# Patient Record
Sex: Male | Born: 2005 | Hispanic: No | Marital: Single | Smoking: Never smoker
Health system: Southern US, Community
[De-identification: ages and names within clinical notes are randomized; demographics above are authoritative.]

## PROBLEM LIST (undated history)

## (undated) DIAGNOSIS — Z201 Contact with and (suspected) exposure to tuberculosis: Secondary | ICD-10-CM

## (undated) DIAGNOSIS — H6690 Otitis media, unspecified, unspecified ear: Secondary | ICD-10-CM

## (undated) DIAGNOSIS — K029 Dental caries, unspecified: Secondary | ICD-10-CM

## (undated) HISTORY — DX: Otitis media, unspecified, unspecified ear: H66.90

## (undated) HISTORY — DX: Dental caries, unspecified: K02.9

---
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
## 2024-04-15 ENCOUNTER — Encounter (HOSPITAL_BASED_OUTPATIENT_CLINIC_OR_DEPARTMENT_OTHER): Payer: Self-pay

## 2024-04-15 ENCOUNTER — Emergency Department (HOSPITAL_BASED_OUTPATIENT_CLINIC_OR_DEPARTMENT_OTHER)
Admission: EM | Admit: 2024-04-15 | Discharge: 2024-04-15 | Disposition: A | Discharge status: Home / Self Care | Attending: Emergency Medicine | Admitting: Emergency Medicine

## 2024-04-15 ENCOUNTER — Other Ambulatory Visit: Payer: Self-pay

## 2024-04-15 DIAGNOSIS — H5713 Ocular pain, bilateral: Secondary | ICD-10-CM | POA: Diagnosis present

## 2024-04-15 DIAGNOSIS — H1013 Acute atopic conjunctivitis, bilateral: Secondary | ICD-10-CM | POA: Insufficient documentation

## 2024-04-15 MED ORDER — FLUORESCEIN SODIUM 1 MG OP STRP
1.0000 | ORAL_STRIP | Freq: Once | OPHTHALMIC | Status: AC
  Administered 2024-04-15: 1 via OPHTHALMIC
  Filled 2024-04-15: qty 1

## 2024-04-15 MED ORDER — OLOPATADINE HCL 0.2 % OP SOLN: 1.0000 [drp] | Freq: Every day | OPHTHALMIC | 0 refills | Status: AC

## 2024-04-15 MED ORDER — TETRACAINE HCL 0.5 % OP SOLN
1.0000 [drp] | Freq: Once | OPHTHALMIC | Status: AC
  Administered 2024-04-15: 1 [drp] via OPHTHALMIC
  Filled 2024-04-15: qty 4

## 2024-04-15 MED ORDER — ERYTHROMYCIN 5 MG/GM OP OINT
TOPICAL_OINTMENT | Freq: Once | OPHTHALMIC | Status: AC
  Filled 2024-04-15: qty 3.5

## 2024-04-15 NOTE — ED Notes (Signed)
 [place] pts left eye with LR through morgan lenses.

## 2024-04-15 NOTE — ED Notes (Signed)
 Pt states that his eyes are feeling better just still very sensitive to light.

## 2024-04-15 NOTE — Discharge Instructions (Signed)
 Use the ointment on your eyes before you go to sleep.  You could also try taking a Benadryl.  Then do the drops once a day.  If you are still having issues call the ophthalmologist for an appointment on Monday

## 2024-04-15 NOTE — ED Notes (Signed)
 Woods lamp and tonopen are at bedside.

## 2024-04-15 NOTE — ED Triage Notes (Signed)
 Patient reports bilateral eye pain, burning, redness, and drainage. Says his left eye is worse today. Notes light sensitivity and able to see distance as well. This started 2-3 days ago.

## 2024-04-15 NOTE — ED Notes (Signed)
 [place] pts right eye with LR through morgan lenses

## 2024-04-15 NOTE — ED Provider Notes (Signed)
 [place] EMERGENCY DEPARTMENT AT [facility] Provider Note   CSN: [id number] Arrival date & time: 04/15/24  [id number]     Patient presents with: Eye Problem   Harold Henderson is a 18 y.o. male.   Patient is a 18 year old male with no significant medical problems who is presenting today with complaint of eye pain that is been ongoing for the last 3 to 4 days.  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  He reports that the day he worked as soon as he came home he started having pain in both eyes.  Initially it was worse in the right eye but now it is moved to the left eye and has been more severe.  He reports a photophobia associated with it and clear tearing of the eyes.  He reports it always seems worse after he wakes up from sleep.  He did get some allergy drops at the pharmacy yesterday but is not sure they have helped at all.  Yesterday when he was at work he did wear safety glasses.  He does not wear glasses or contacts.  Has no history of any trauma to the eyes.  Other than using the drops yesterday he has not been putting anything else in his eyes.  Denies any foreign body sensation  The history is provided by the patient.  Eye Problem      Prior to Admission medications   Medication Sig Start Date End Date Taking? Authorizing Provider  Olopatadine HCl 0.2 % SOLN Apply 1 drop to eye daily. 04/15/24  Yes Doretha Folks, MD  Adapalene -Benzoyl Peroxide  (EPIDUO ) 0.1-2.5 % gel Apply topically at bedtime Patient not taking: Reported on 09/03/2020 03/06/20   Herminio Kirsch, MD  carbamide peroxide (DEBROX) 6.5 % OTIC solution Place 5 drops into the left ear 2 (two) times daily. Patient not taking: Reported on 09/03/2020 01/17/20   Segars, James, MD  mupirocin  ointment (BACTROBAN ) 2 % Apply to right ear canal 3 times daily for 1 week Patient not taking:  Reported on 09/03/2020 10/05/19   Herminio Kirsch, MD    Allergies: Patient has no known allergies.    Review of Systems  Updated Vital Signs BP 110/86   Pulse (!) 52   Temp 97.9 F (36.6 C)   Resp 18   SpO2 100%   Physical Exam Vitals reviewed.  HENT:     Head: Normocephalic.  Eyes:     General: Lids are normal. Lids are everted, no foreign bodies appreciated.        Right eye: No foreign body or discharge.        Left eye: No foreign body or discharge.     Intraocular pressure: Right eye pressure is 15 mmHg. Left eye pressure is 21 mmHg. Measurements were taken using a handheld tonometer.    Extraocular Movements: Extraocular movements intact.     Pupils: Pupils are equal, round, and reactive to light.     Right eye: No corneal abrasion or fluorescein uptake.     Left eye: No corneal abrasion or fluorescein uptake.     Comments: Photophobic with tearing in both eyes and minimal conjunctival injection.  Pupils are reactive bilaterally.  Eyelids are normal without any swelling  Neurological:     Mental Status: He is alert. Mental status is at baseline.     (all labs ordered  are listed, but only abnormal results are displayed) Labs Reviewed - No data to display  EKG: None  Radiology: No results found.   Procedures   Medications Ordered in the ED  erythromycin ophthalmic ointment (has no administration in time range)  tetracaine (PONTOCAINE) 0.5 % ophthalmic solution 1 drop (1 drop Both Eyes Given 04/15/24 0820)  fluorescein ophthalmic strip 1 strip (1 strip Both Eyes Given 04/15/24 0827)                                    Medical Decision Making Risk Prescription drug management.   Patient presenting today with symptoms most classic for allergic reaction or contact irritation of bilateral eyes.  Only minimal conjunctival injection and pupils are responsive bilaterally but patient has significant photophobia.  Intraocular pressure is normal bilaterally.  No  corneal abrasions noted.  Patient's eyes were flushed. Patient is still having stinging in the eyes but reports maybe a little better after flushed with Joesph lens.  At this time patient appears stable for discharge but was given ophthalmology follow-up if he continues to have issues.  He was given drops to use in his eyes for potential allergic reaction.     Final diagnoses:  Allergic conjunctivitis of both eyes    ED Discharge Orders          Ordered    Olopatadine HCl 0.2 % SOLN  Daily        04/15/24 1115               Doretha Folks, MD 04/15/24 1116

## 2024-05-17 NOTE — Progress Notes (Incomplete)
  Cardiology Office Note:  .   Date:  05/17/2024  ID:  Gatha Bayley, DOB March 31, 2006, MRN [id number] PCP: Patient, No Pcp Per  [facility] Providers Cardiologist:  None {  History of Present Illness: Harold Henderson   Andren Bethea is a 18 y.o. male self referred for chest pain. No recent notes/records available.    ROS: Denies chest pain, shortness of breath at rest or with normal exertion. No PND, orthopnea, LE edema or unexpected weight gain. No syncope or palpitations. ROS otherwise negative except as noted.   Studies Reviewed: Harold Henderson    EKG:       Physical Exam:   VS:  There were no vitals taken for this visit.   Wt Readings from Last 3 Encounters:  09/03/20 151 lb 3.2 oz (68.6 kg) (79%, Z= 0.82)*  01/17/20 146 lb 3.2 oz (66.3 kg) (81%, Z= 0.89)*  10/05/19 151 lb 9.6 oz (68.8 kg) (88%, Z= 1.18)*   * Growth percentiles are based on CDC (Boys, 2-20 Years) data.    GEN: Well nourished, well developed in no acute distress HEENT: Normal, moist mucous membranes NECK: No JVD CARDIAC: regular rhythm, normal S1 and S2, no rubs or gallops. No murmur. VASCULAR: Radial and DP pulses 2+ bilaterally. No carotid bruits RESPIRATORY:  Clear to auscultation without rales, wheezing or rhonchi  ABDOMEN: Soft, non-tender, non-distended MUSCULOSKELETAL:  Ambulates independently SKIN: Warm and dry, no edema NEUROLOGIC:  Alert and oriented x 3. No focal neuro deficits noted. PSYCHIATRIC:  Normal affect    ASSESSMENT AND PLAN: .     CV risk counseling and prevention -recommend heart healthy/Mediterranean diet, with whole grains, fruits, vegetable, fish, lean meats, nuts, and olive oil. Limit salt. -recommend moderate walking, 3-5 times/week for 30-50 minutes each session. Aim for at least 150 minutes/week. Goal should be pace of 3 miles/hours, or walking 1.5 miles in 30 minutes -recommend avoidance of tobacco products. Avoid excess alcohol. -ASCVD risk score: The ASCVD Risk score (Arnett DK, et al.,  2019) failed to calculate for the following reasons:   The 2019 ASCVD risk score is only valid for ages 62 to 67    Dispo: ***  Signed, Shelda Bruckner, MD   Shelda Bruckner, MD, PhD, [facility] [place]  [facility]  [place]  [facility] at [facility] at [facility] [address] [place], [place] [address] [phone]

## 2024-05-18 ENCOUNTER — Ambulatory Visit (HOSPITAL_BASED_OUTPATIENT_CLINIC_OR_DEPARTMENT_OTHER): Admitting: Cardiology
